# Patient Record
Sex: Female | Born: 1950 | Race: White | Hispanic: No | Marital: Married | Smoking: Never smoker
Health system: Southern US, Community
[De-identification: ages and names within clinical notes are randomized; demographics above are authoritative.]

## PROBLEM LIST (undated history)

## (undated) DIAGNOSIS — G8929 Other chronic pain: Secondary | ICD-10-CM

## (undated) DIAGNOSIS — F419 Anxiety disorder, unspecified: Secondary | ICD-10-CM

## (undated) DIAGNOSIS — E785 Hyperlipidemia, unspecified: Secondary | ICD-10-CM

## (undated) DIAGNOSIS — J189 Pneumonia, unspecified organism: Secondary | ICD-10-CM

## (undated) DIAGNOSIS — M25461 Effusion, right knee: Secondary | ICD-10-CM

## (undated) DIAGNOSIS — R519 Headache, unspecified: Secondary | ICD-10-CM

## (undated) DIAGNOSIS — I499 Cardiac arrhythmia, unspecified: Secondary | ICD-10-CM

## (undated) DIAGNOSIS — I10 Essential (primary) hypertension: Secondary | ICD-10-CM

## (undated) DIAGNOSIS — M1711 Unilateral primary osteoarthritis, right knee: Secondary | ICD-10-CM

## (undated) DIAGNOSIS — E559 Vitamin D deficiency, unspecified: Secondary | ICD-10-CM

## (undated) DIAGNOSIS — N950 Postmenopausal bleeding: Secondary | ICD-10-CM

## (undated) DIAGNOSIS — M1990 Unspecified osteoarthritis, unspecified site: Secondary | ICD-10-CM

## (undated) DIAGNOSIS — C801 Malignant (primary) neoplasm, unspecified: Secondary | ICD-10-CM

## (undated) HISTORY — PX: DILATION AND CURETTAGE OF UTERUS: SHX78

## (undated) HISTORY — PX: TONSILLECTOMY: SUR1361

## (undated) HISTORY — PX: WISDOM TOOTH EXTRACTION: SHX21

---

## 2005-08-19 ENCOUNTER — Ambulatory Visit: Payer: Self-pay | Admitting: Internal Medicine

## 2006-05-10 HISTORY — PX: COLONOSCOPY W/ POLYPECTOMY: SHX1380

## 2006-06-14 ENCOUNTER — Ambulatory Visit: Payer: Self-pay | Admitting: Unknown Physician Specialty

## 2006-06-14 ENCOUNTER — Other Ambulatory Visit: Payer: Self-pay

## 2009-04-30 ENCOUNTER — Ambulatory Visit: Payer: Self-pay | Admitting: Internal Medicine

## 2013-12-20 ENCOUNTER — Ambulatory Visit: Payer: Self-pay | Admitting: Internal Medicine

## 2015-11-25 ENCOUNTER — Other Ambulatory Visit: Payer: Self-pay | Admitting: Internal Medicine

## 2015-11-25 DIAGNOSIS — Z1231 Encounter for screening mammogram for malignant neoplasm of breast: Secondary | ICD-10-CM

## 2015-12-11 ENCOUNTER — Ambulatory Visit: Payer: Self-pay | Attending: Internal Medicine

## 2016-05-10 HISTORY — PX: COLONOSCOPY: SHX174

## 2017-05-17 ENCOUNTER — Other Ambulatory Visit: Payer: Self-pay | Admitting: Internal Medicine

## 2017-05-17 DIAGNOSIS — Z1231 Encounter for screening mammogram for malignant neoplasm of breast: Secondary | ICD-10-CM

## 2017-10-13 ENCOUNTER — Encounter (HOSPITAL_COMMUNITY): Payer: Self-pay

## 2017-10-13 ENCOUNTER — Ambulatory Visit
Admission: RE | Admit: 2017-10-13 | Discharge: 2017-10-13 | Disposition: A | Discharge status: Home / Self Care | Payer: Medicare Other | Source: Ambulatory Visit | Attending: Internal Medicine | Admitting: Internal Medicine

## 2017-10-13 DIAGNOSIS — Z1231 Encounter for screening mammogram for malignant neoplasm of breast: Secondary | ICD-10-CM | POA: Diagnosis not present

## 2017-10-13 HISTORY — DX: Malignant (primary) neoplasm, unspecified: C80.1

## 2019-04-13 ENCOUNTER — Other Ambulatory Visit: Payer: Self-pay

## 2019-04-13 DIAGNOSIS — Z20822 Contact with and (suspected) exposure to covid-19: Secondary | ICD-10-CM

## 2019-04-15 LAB — NOVEL CORONAVIRUS, NAA: SARS-CoV-2, NAA: NOT DETECTED

## 2019-06-20 IMAGING — MG MM DIGITAL SCREENING BILAT W/ TOMO W/ CAD
8 series · 8 of 24 positions shown · non-contrast
Comparison: Previous exam(s).

CLINICAL DATA: Screening.

EXAM:
DIGITAL SCREENING BILATERAL MAMMOGRAM WITH TOMO AND CAD

[R MLO synth-2D]
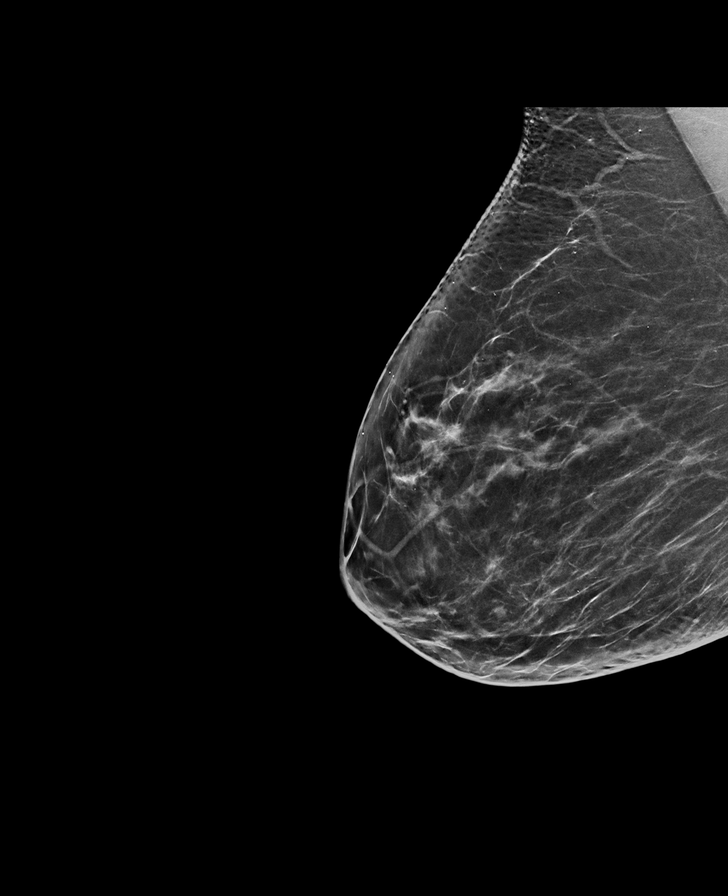

[R CC synth-2D]
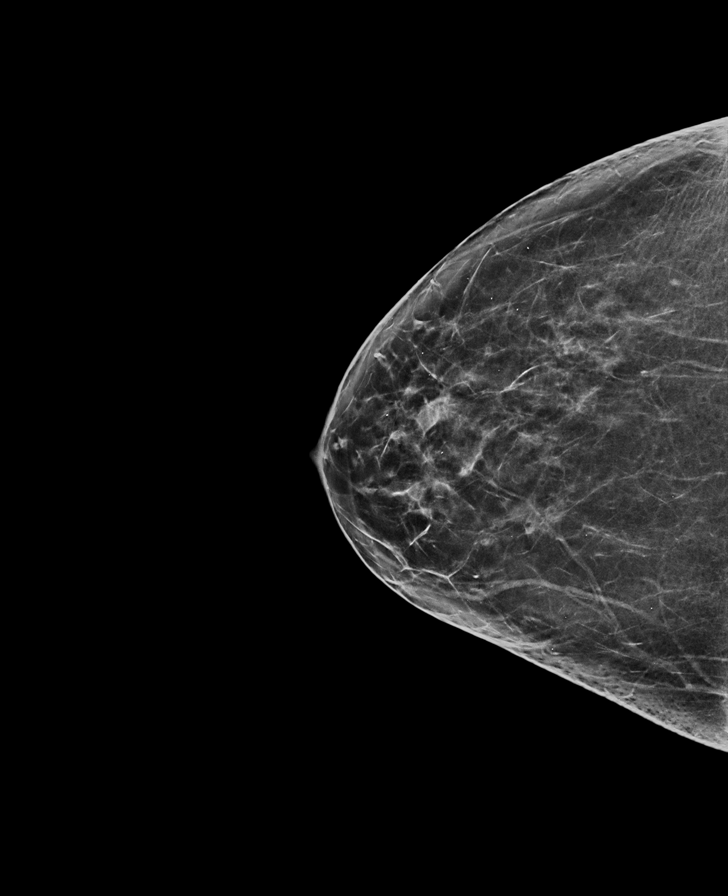

[L CC synth-2D]
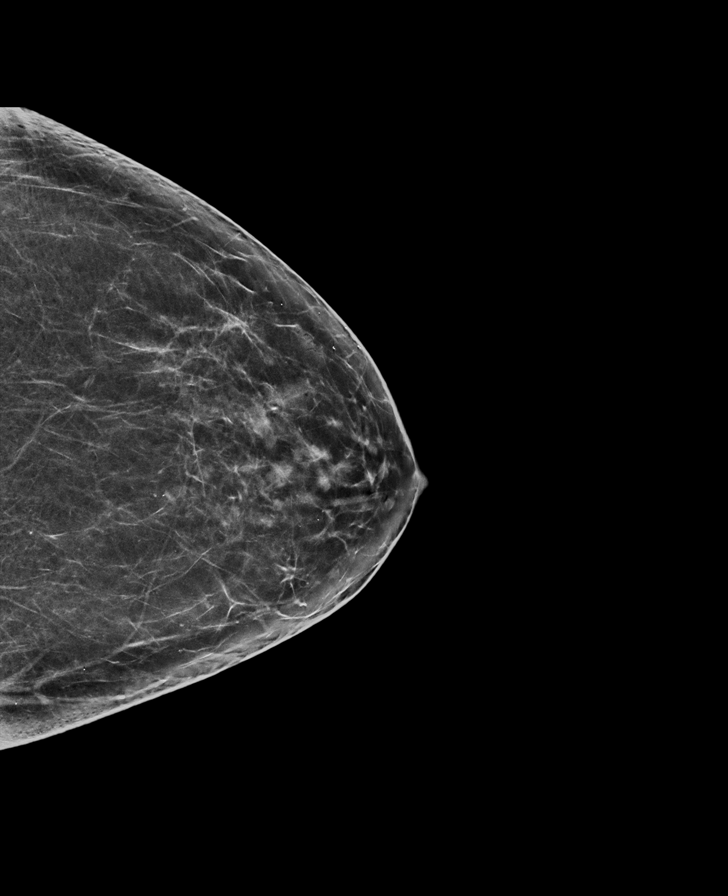

[L MLO synth-2D]
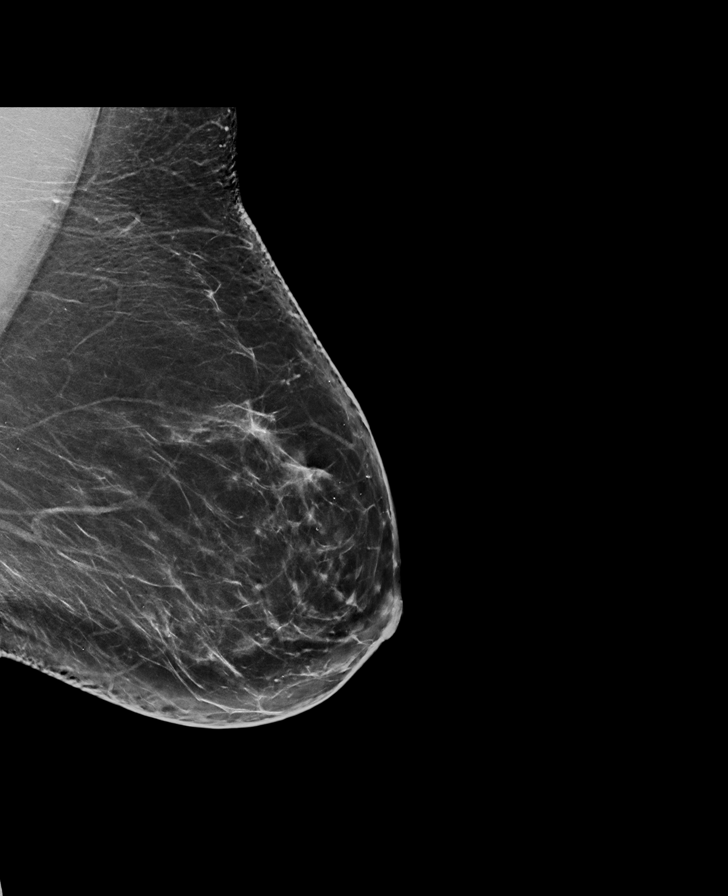

[R MLO tomo · tomo slice 37/72.0]
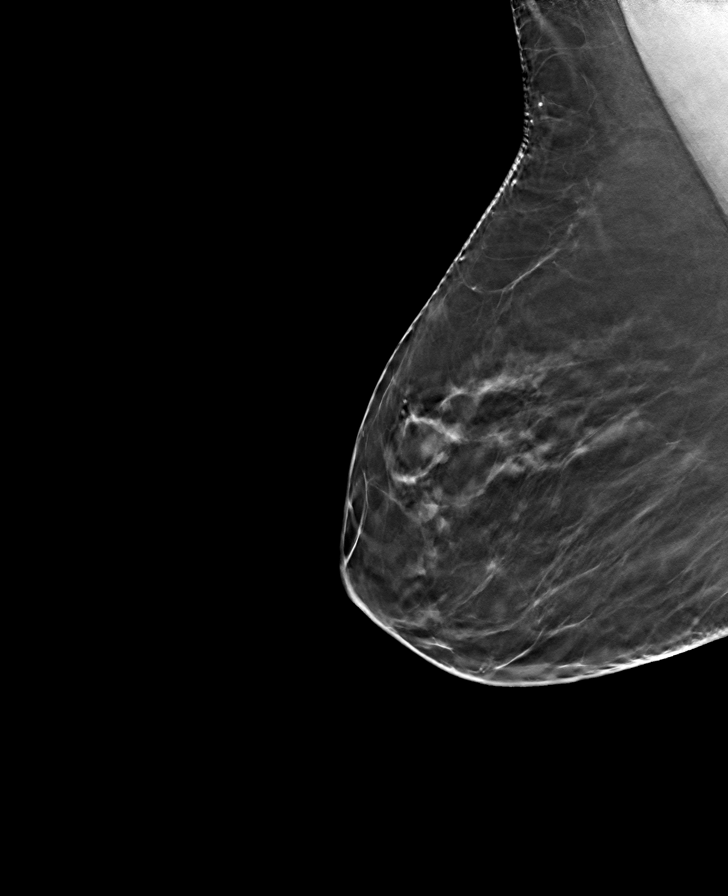

[L CC tomo · tomo slice 35/69.0]
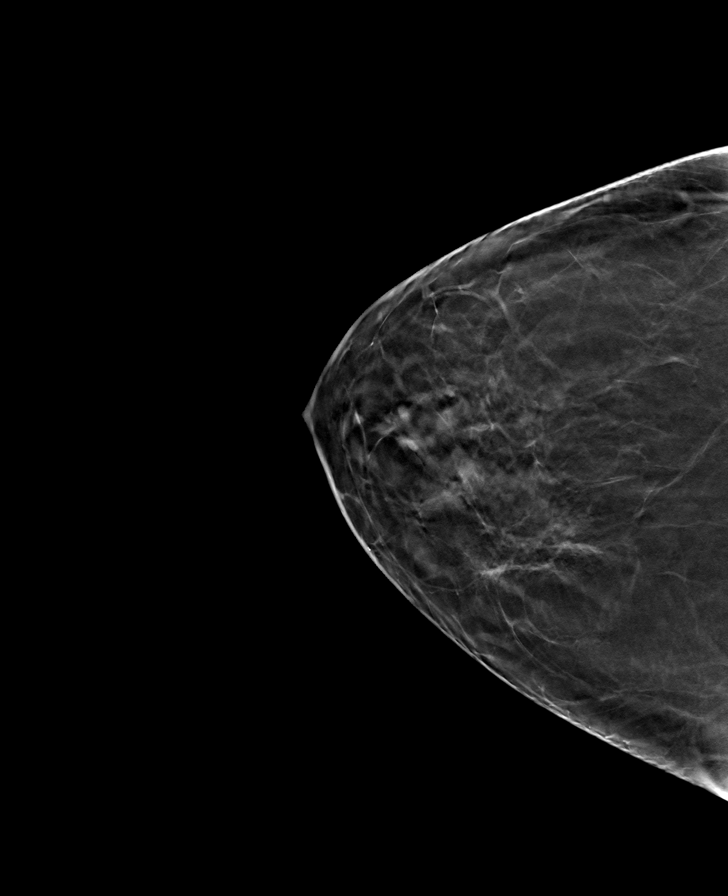

[R CC tomo · tomo slice 35/69.0]
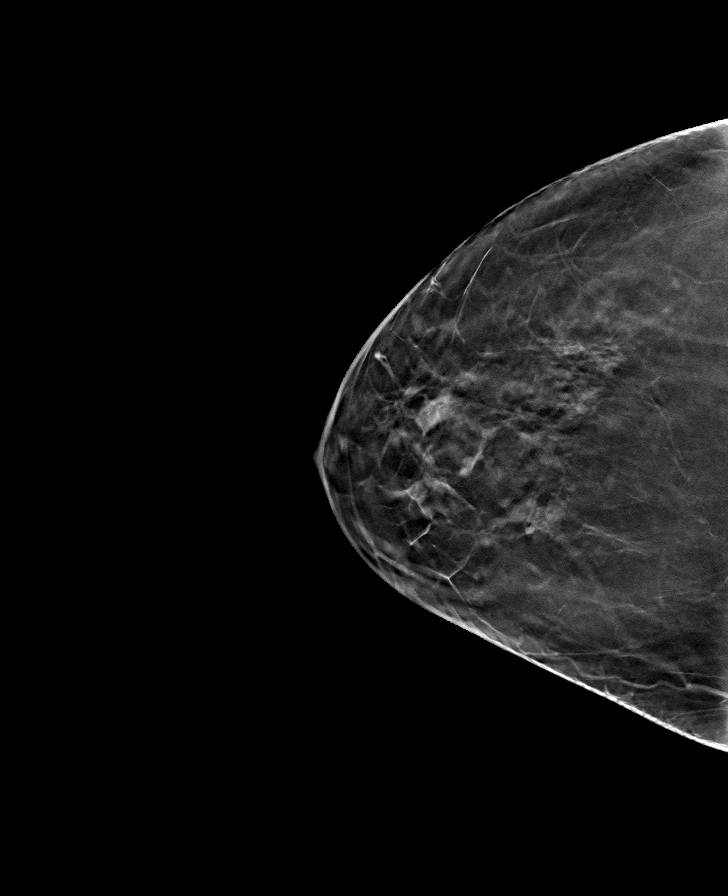

[L MLO tomo · tomo slice 43/85.0]
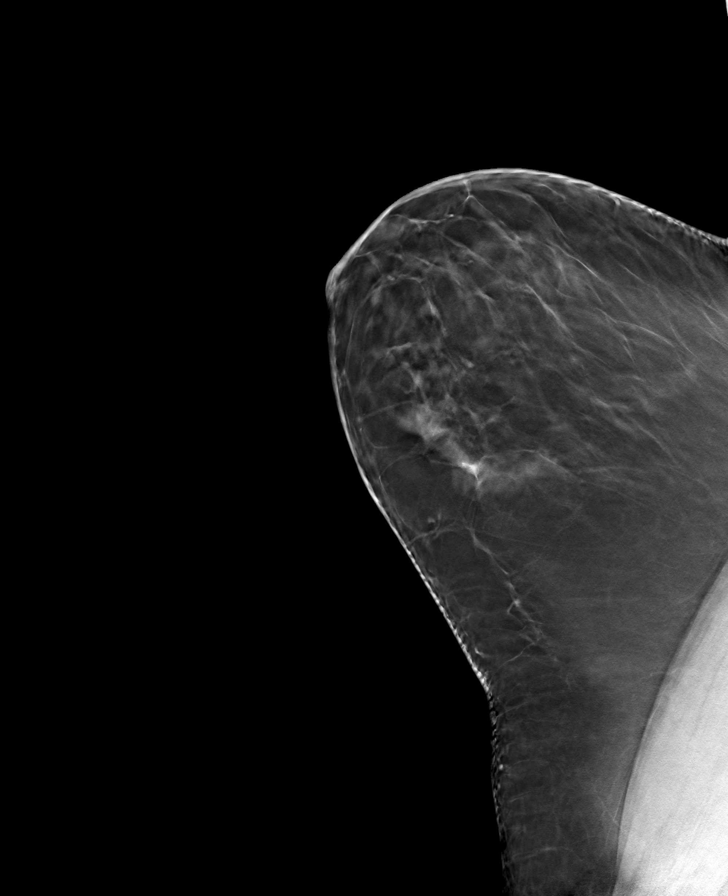

[8 of 24 positions shown; findings below may reference images not displayed]

ACR Breast Density Category b: There are scattered areas of
fibroglandular density.
FINDINGS: There are no findings suspicious for malignancy. Images were
processed with CAD.
IMPRESSION: No mammographic evidence of malignancy. A result letter of this
screening mammogram will be mailed directly to the patient.

RECOMMENDATION:
Screening mammogram in one year. (Code:[id number])

BI-RADS CATEGORY  1: Negative.

## 2019-09-13 ENCOUNTER — Other Ambulatory Visit: Payer: Self-pay | Admitting: Internal Medicine

## 2020-03-06 ENCOUNTER — Other Ambulatory Visit: Payer: Self-pay | Admitting: Internal Medicine

## 2020-03-06 DIAGNOSIS — Z1231 Encounter for screening mammogram for malignant neoplasm of breast: Secondary | ICD-10-CM

## 2020-04-14 ENCOUNTER — Other Ambulatory Visit: Payer: Self-pay

## 2020-04-14 ENCOUNTER — Ambulatory Visit
Admission: RE | Admit: 2020-04-14 | Discharge: 2020-04-14 | Disposition: A | Discharge status: Home / Self Care | Payer: Medicare Other | Source: Ambulatory Visit | Attending: Internal Medicine | Admitting: Internal Medicine

## 2020-04-14 DIAGNOSIS — Z1231 Encounter for screening mammogram for malignant neoplasm of breast: Secondary | ICD-10-CM | POA: Insufficient documentation

## 2020-05-10 HISTORY — PX: APPENDECTOMY: SHX54

## 2021-03-16 ENCOUNTER — Other Ambulatory Visit: Payer: Self-pay | Admitting: Internal Medicine

## 2021-05-20 ENCOUNTER — Other Ambulatory Visit: Payer: Self-pay | Admitting: Internal Medicine

## 2021-05-20 DIAGNOSIS — Z1231 Encounter for screening mammogram for malignant neoplasm of breast: Secondary | ICD-10-CM

## 2021-09-24 ENCOUNTER — Ambulatory Visit
Admission: RE | Admit: 2021-09-24 | Discharge: 2021-09-24 | Disposition: A | Discharge status: Home / Self Care | Payer: Medicare Other | Source: Ambulatory Visit | Attending: Internal Medicine | Admitting: Internal Medicine

## 2021-09-24 DIAGNOSIS — Z1231 Encounter for screening mammogram for malignant neoplasm of breast: Secondary | ICD-10-CM | POA: Diagnosis present

## 2022-04-21 ENCOUNTER — Other Ambulatory Visit: Payer: Self-pay | Admitting: Obstetrics and Gynecology

## 2022-04-21 DIAGNOSIS — N950 Postmenopausal bleeding: Secondary | ICD-10-CM

## 2022-04-21 DIAGNOSIS — N889 Noninflammatory disorder of cervix uteri, unspecified: Secondary | ICD-10-CM

## 2022-04-29 ENCOUNTER — Ambulatory Visit: Payer: Medicare Other

## 2022-05-11 ENCOUNTER — Other Ambulatory Visit: Payer: Medicare Other

## 2022-05-13 ENCOUNTER — Ambulatory Visit
Admission: RE | Admit: 2022-05-13 | Discharge: 2022-05-13 | Disposition: A | Discharge status: Home / Self Care | Payer: Medicare Other | Source: Ambulatory Visit | Attending: Obstetrics and Gynecology | Admitting: Obstetrics and Gynecology

## 2022-05-13 DIAGNOSIS — N950 Postmenopausal bleeding: Secondary | ICD-10-CM | POA: Insufficient documentation

## 2022-05-13 DIAGNOSIS — N889 Noninflammatory disorder of cervix uteri, unspecified: Secondary | ICD-10-CM | POA: Insufficient documentation

## 2022-05-20 ENCOUNTER — Other Ambulatory Visit: Payer: Self-pay | Admitting: Obstetrics and Gynecology

## 2022-05-24 ENCOUNTER — Ambulatory Visit: Payer: Medicare Other | Admitting: Certified Registered Nurse Anesthetist

## 2022-05-24 ENCOUNTER — Encounter
Admission: RE | Admit: 2022-05-24 | Discharge: 2022-05-24 | Disposition: A | Discharge status: Home / Self Care | Payer: Medicare Other | Source: Ambulatory Visit | Attending: Obstetrics and Gynecology | Admitting: Obstetrics and Gynecology

## 2022-05-24 ENCOUNTER — Ambulatory Visit: Payer: Medicare Other | Admitting: Urgent Care

## 2022-05-24 DIAGNOSIS — Z01812 Encounter for preprocedural laboratory examination: Secondary | ICD-10-CM

## 2022-05-24 HISTORY — DX: Postmenopausal bleeding: N95.0

## 2022-05-24 HISTORY — DX: Essential (primary) hypertension: I10

## 2022-05-24 HISTORY — DX: Headache, unspecified: R51.9

## 2022-05-24 HISTORY — DX: Other chronic pain: G89.29

## 2022-05-24 HISTORY — DX: Vitamin D deficiency, unspecified: E55.9

## 2022-05-24 HISTORY — DX: Hyperlipidemia, unspecified: E78.5

## 2022-05-24 HISTORY — DX: Effusion, right knee: M25.461

## 2022-05-24 HISTORY — DX: Unspecified osteoarthritis, unspecified site: M19.90

## 2022-05-24 HISTORY — DX: Unilateral primary osteoarthritis, right knee: M17.11

## 2022-05-24 HISTORY — DX: Anxiety disorder, unspecified: F41.9

## 2022-05-24 HISTORY — DX: Pneumonia, unspecified organism: J18.9

## 2022-05-24 HISTORY — DX: Cardiac arrhythmia, unspecified: I49.9

## 2022-05-24 NOTE — Patient Instructions (Addendum)
Your procedure is scheduled on: 06/03/22 - Thursday Report to the Registration Desk on the 1st floor of the [facility]. To find out your arrival time, please call [phone] between 1PM - 3PM on: 06/02/22 - Wednesday If your arrival time is 6:00 am, do not arrive prior to that time as the [place] entrance doors do not open until 6:00 am.  REMEMBER: Instructions that are not followed completely may result in serious medical risk, up to and including death; or upon the discretion of your surgeon and anesthesiologist your surgery may need to be rescheduled.  Do not eat food after midnight the night before surgery.  No gum chewing, lozengers or hard candies.  You may however, drink CLEAR liquids up to 2 hours before you are scheduled to arrive for your surgery. Do not drink anything within 2 hours of your scheduled arrival time.  Clear liquids include: - water  - apple juice without pulp - gatorade (not RED colors) - black coffee or tea (Do NOT add milk or creamers to the coffee or tea) Do NOT drink anything that is not on this list.  In addition, your doctor has ordered for you to drink the provided  Ensure Pre-Surgery Clear Carbohydrate Drink  Drinking this carbohydrate drink up to two hours before surgery helps to reduce insulin resistance and improve patient outcomes. Please complete drinking 2 hours prior to scheduled arrival time.  TAKE THESE MEDICATIONS THE MORNING OF SURGERY WITH A SIP OF WATER:  - amLODipine (NORVASC)  - metoprolol succinate (TOPROL-XL)   One week prior to surgery: Stop beginning 05/27/22: Stop Anti-inflammatories (NSAIDS) such as Advil, Aleve, Ibuprofen, Motrin, Naproxen, Naprosyn and Aspirin based products such as Excedrin, Goodys Powder, BC Powder.  Stop ANY OVER THE COUNTER supplements until after surgery.  You may however, continue to take Tylenol if needed for pain up until the day of surgery.  No Alcohol for 24 hours before or after  surgery.  No Smoking including e-cigarettes for 24 hours prior to surgery.  No chewable tobacco products for at least 6 hours prior to surgery.  No nicotine patches on the day of surgery.  Do not use any "recreational" drugs for at least a week prior to your surgery.  Please be advised that the combination of cocaine and anesthesia may have negative outcomes, up to and including death. If you test positive for cocaine, your surgery will be cancelled.  On the morning of surgery brush your teeth with toothpaste and water, you may rinse your mouth with mouthwash if you wish. Do not swallow any toothpaste or mouthwash.  Do not wear jewelry, make-up, hairpins, clips or nail polish.  Do not wear lotions, powders, or perfumes.   Do not shave body from the neck down 48 hours prior to surgery just in case you cut yourself which could leave a site for infection. Also, freshly shaved skin may become irritated if using the CHG soap.  Contact lenses, hearing aids and dentures may not be worn into surgery.  Do not bring valuables to the hospital. [facility] is not responsible for any missing/lost belongings or valuables.   Notify your doctor if there is any change in your medical condition (cold, fever, infection).  Wear comfortable clothing (specific to your surgery type) to the hospital.  After surgery, you can help prevent lung complications by doing breathing exercises.  Take deep breaths and cough every 1-2 hours. Your doctor may order a device called an Incentive Spirometer to help  you take deep breaths. When coughing or sneezing, hold a pillow firmly against your incision with both hands. This is called "splinting." Doing this helps protect your incision. It also decreases belly discomfort.  If you are being admitted to the hospital overnight, leave your suitcase in the car. After surgery it may be brought to your room.  If you are being discharged the day of surgery, you will not be  allowed to drive home. You will need a responsible adult (18 years or older) to drive you home and stay with you that night.   If you are taking public transportation, you will need to have a responsible adult (18 years or older) with you. Please confirm with your physician that it is acceptable to use public transportation.   Please call the [place] Dept. at [phone] if you have any questions about these instructions.  Surgery Visitation Policy:  Patients undergoing a surgery or procedure may have two family members or support persons with them as long as the person is not COVID-19 positive or experiencing its symptoms.   Inpatient Visitation:    Visiting hours are 7 a.m. to 8 p.m. Up to four visitors are allowed at one time in a patient room. The visitors may rotate out with other people during the day. One designated support person (adult) may remain overnight.  Due to an increase in RSV and influenza rates and associated hospitalizations, children ages 20 and under will not be able to visit patients in [facility]. Masks continue to be strongly recommended.

## 2022-05-28 ENCOUNTER — Encounter
Admission: RE | Admit: 2022-05-28 | Discharge: 2022-05-28 | Disposition: A | Discharge status: Home / Self Care | Payer: Medicare Other | Source: Ambulatory Visit | Attending: Obstetrics and Gynecology | Admitting: Obstetrics and Gynecology

## 2022-05-28 DIAGNOSIS — R9389 Abnormal findings on diagnostic imaging of other specified body structures: Secondary | ICD-10-CM | POA: Diagnosis not present

## 2022-05-28 DIAGNOSIS — N841 Polyp of cervix uteri: Secondary | ICD-10-CM | POA: Insufficient documentation

## 2022-05-28 DIAGNOSIS — Z01812 Encounter for preprocedural laboratory examination: Secondary | ICD-10-CM

## 2022-05-28 DIAGNOSIS — N950 Postmenopausal bleeding: Secondary | ICD-10-CM | POA: Diagnosis not present

## 2022-05-28 LAB — BASIC METABOLIC PANEL
Anion gap: 10 (ref 5–15)
BUN: 12 mg/dL (ref 8–23)
CO2: 25 mmol/L (ref 22–32)
Calcium: 9.4 mg/dL (ref 8.9–10.3)
Chloride: 101 mmol/L (ref 98–111)
Creatinine, Ser: 0.62 mg/dL (ref 0.44–1.00)
GFR, Estimated: >60 mL/min (ref >60)
Glucose, Bld: 85 mg/dL (ref 70–99)
Potassium: 3.9 mmol/L (ref 3.5–5.1)
Sodium: 136 mmol/L (ref 135–145)

## 2022-05-28 LAB — CBC
HCT: 45.5 % (ref 36.0–46.0)
Hemoglobin: 14.4 g/dL (ref 12.0–15.0)
MCH: 29.6 pg (ref 26.0–34.0)
MCHC: 31.6 g/dL (ref 30.0–36.0)
MCV: 93.4 fL (ref 80.0–100.0)
Platelets: 274 10*3/uL (ref 150–400)
RBC: 4.87 MIL/uL (ref 3.87–5.11)
RDW: 13.3 % (ref 11.5–15.5)
WBC: 7.7 10*3/uL (ref 4.0–10.5)
nRBC: 0 % (ref 0.0–0.2)

## 2022-05-28 LAB — TYPE AND SCREEN
ABO/RH(D): A POS
Antibody Screen: NEGATIVE

## 2022-05-29 LAB — SYPHILIS: RPR W/REFLEX TO RPR TITER AND TREPONEMAL ANTIBODIES, TRADITIONAL SCREENING AND DIAGNOSIS ALGORITHM: RPR Ser Ql: NONREACTIVE

## 2022-06-03 ENCOUNTER — Encounter: Payer: Self-pay | Admitting: Obstetrics and Gynecology

## 2022-06-03 ENCOUNTER — Ambulatory Visit
Admission: RE | Admit: 2022-06-03 | Discharge: 2022-06-03 | Disposition: A | Discharge status: Home / Self Care | Payer: Medicare Other | Source: Ambulatory Visit | Attending: Obstetrics and Gynecology | Admitting: Obstetrics and Gynecology

## 2022-06-03 ENCOUNTER — Encounter
Admission: RE | Disposition: A | Discharge status: Home / Self Care | Payer: Self-pay | Source: Ambulatory Visit | Attending: Obstetrics and Gynecology

## 2022-06-03 ENCOUNTER — Other Ambulatory Visit: Payer: Self-pay

## 2022-06-03 DIAGNOSIS — N950 Postmenopausal bleeding: Secondary | ICD-10-CM | POA: Diagnosis present

## 2022-06-03 DIAGNOSIS — Z538 Procedure and treatment not carried out for other reasons: Secondary | ICD-10-CM | POA: Diagnosis not present

## 2022-06-03 DIAGNOSIS — N841 Polyp of cervix uteri: Secondary | ICD-10-CM | POA: Diagnosis not present

## 2022-06-03 DIAGNOSIS — R9389 Abnormal findings on diagnostic imaging of other specified body structures: Secondary | ICD-10-CM | POA: Diagnosis not present

## 2022-06-03 LAB — ABO/RH: ABO/RH(D): A POS

## 2022-06-03 SURGERY — DILATATION AND CURETTAGE /HYSTEROSCOPY
Anesthesia: General

## 2022-06-03 MED ORDER — ORAL CARE MOUTH RINSE: 15.0000 mL | Freq: Once | OROMUCOSAL | Status: AC

## 2022-06-03 MED ORDER — LACTATED RINGERS IV SOLN: INTRAVENOUS | Status: DC

## 2022-06-03 MED ORDER — FAMOTIDINE 20 MG PO TABS: 20.0000 mg | ORAL_TABLET | Freq: Once | ORAL | Status: AC

## 2022-06-03 MED ORDER — CHLORHEXIDINE GLUCONATE 0.12 % MT SOLN: 15.0000 mL | Freq: Once | OROMUCOSAL | Status: AC

## 2022-06-03 MED ORDER — FENTANYL CITRATE (PF) 100 MCG/2ML IJ SOLN
INTRAMUSCULAR | Status: AC
  Filled 2022-06-03: qty 2

## 2022-06-03 MED ORDER — CHLORHEXIDINE GLUCONATE 0.12 % MT SOLN
OROMUCOSAL | Status: AC
  Administered 2022-06-03: 15 mL via OROMUCOSAL
  Filled 2022-06-03: qty 15

## 2022-06-03 MED ORDER — LIDOCAINE HCL (PF) 2 % IJ SOLN
INTRAMUSCULAR | Status: AC
  Filled 2022-06-03: qty 5

## 2022-06-03 MED ORDER — PROPOFOL 10 MG/ML IV BOLUS
INTRAVENOUS | Status: AC
  Filled 2022-06-03: qty 20

## 2022-06-03 MED ORDER — FAMOTIDINE 20 MG PO TABS
ORAL_TABLET | ORAL | Status: AC
  Administered 2022-06-03: 20 mg via ORAL
  Filled 2022-06-03: qty 1

## 2022-06-03 MED ORDER — MIDAZOLAM HCL 2 MG/2ML IJ SOLN
INTRAMUSCULAR | Status: AC
  Filled 2022-06-03: qty 2

## 2022-06-03 SURGICAL SUPPLY — 26 items
BAG URINE DRAIN 2000ML AR STRL (UROLOGICAL SUPPLIES) IMPLANT
CATH FOLEY 2WAY  5CC 16FR (CATHETERS)
CATH URTH 16FR FL 2W BLN LF (CATHETERS) IMPLANT
DEVICE MYOSURE LITE (MISCELLANEOUS) IMPLANT
DEVICE MYOSURE REACH (MISCELLANEOUS) IMPLANT
DRSG TELFA 3X8 NADH STRL (GAUZE/BANDAGES/DRESSINGS) IMPLANT
ELECT REM PT RETURN 9FT ADLT (ELECTROSURGICAL) ×1
ELECTRODE REM PT RTRN 9FT ADLT (ELECTROSURGICAL) ×1 IMPLANT
GLOVE BIO SURGEON STRL SZ7 (GLOVE) ×1 IMPLANT
GLOVE BIOGEL PI IND STRL 7.5 (GLOVE) ×1 IMPLANT
GOWN STRL REUS W/ TWL LRG LVL3 (GOWN DISPOSABLE) ×2 IMPLANT
GOWN STRL REUS W/TWL LRG LVL3 (GOWN DISPOSABLE) ×2
IV NS IRRIG 3000ML ARTHROMATIC (IV SOLUTION) ×1 IMPLANT
KIT PROCEDURE FLUENT (KITS) ×1 IMPLANT
KIT TURNOVER CYSTO (KITS) ×1 IMPLANT
MANIFOLD NEPTUNE II (INSTRUMENTS) ×1 IMPLANT
PACK DNC HYST (MISCELLANEOUS) ×1 IMPLANT
PAD OB MATERNITY 4.3X12.25 (PERSONAL CARE ITEMS) ×1 IMPLANT
PAD PREP 24X41 OB/GYN DISP (PERSONAL CARE ITEMS) ×1 IMPLANT
SCRUB CHG 4% DYNA-HEX 4OZ (MISCELLANEOUS) ×1 IMPLANT
SEAL ROD LENS SCOPE MYOSURE (ABLATOR) ×1 IMPLANT
SET CYSTO W/LG BORE CLAMP LF (SET/KITS/TRAYS/PACK) IMPLANT
SURGILUBE 2OZ TUBE FLIPTOP (MISCELLANEOUS) ×1 IMPLANT
TRAP FLUID SMOKE EVACUATOR (MISCELLANEOUS) ×1 IMPLANT
TUBING CONNECTING 10 (TUBING) ×1 IMPLANT
WATER STERILE IRR 500ML POUR (IV SOLUTION) ×1 IMPLANT

## 2022-06-03 NOTE — Discharge Instructions (Signed)

## 2022-06-03 NOTE — Anesthesia Preprocedure Evaluation (Deleted)
Anesthesia Evaluation  Patient identified by MRN, date of birth, ID band Patient awake    Reviewed: Allergy & Precautions, NPO status , Patient's Chart, lab work & pertinent test results  History of Anesthesia Complications Negative for: history of anesthetic complications  Airway Mallampati: III  TM Distance: <3 FB Neck ROM: full    Dental  (+) Chipped, Poor Dentition   Pulmonary neg pulmonary ROS, neg shortness of breath   Pulmonary exam normal        Cardiovascular Exercise Tolerance: Good hypertension, (-) Past MI + dysrhythmias Supra Ventricular Tachycardia      Neuro/Psych  Headaches  Anxiety        GI/Hepatic negative GI ROS, Neg liver ROS,neg GERD  ,,  Endo/Other  negative endocrine ROS    Renal/GU      Musculoskeletal   Abdominal   Peds  Hematology negative hematology ROS (+)   Anesthesia Other Findings Past Medical History: No date: Anxiety No date: Arthritis No date: Cancer ([place])     Comment:  skin No date: Chronic pain of right knee No date: Dysrhythmia     Comment:  racing heartbeat No date: Effusion of right knee No date: Headache No date: HLD (hyperlipidemia) No date: Hypertension No date: Osteoarthritis of right knee No date: PMB (postmenopausal bleeding) No date: Pneumonia     Comment:  1983 No date: Vitamin D deficiency  Past Surgical History: 2022: APPENDECTOMY 2018: COLONOSCOPY 2008: COLONOSCOPY W/ POLYPECTOMY No date: DILATION AND CURETTAGE OF UTERUS     Comment:  1981 No date: TONSILLECTOMY No date: WISDOM TOOTH EXTRACTION  BMI    Body Mass Index: 32.44 kg/m      Reproductive/Obstetrics negative OB ROS                             Anesthesia Physical Anesthesia Plan  ASA: 2  Anesthesia Plan: General LMA   Post-op Pain Management:    Induction: Intravenous  PONV Risk Score and Plan: Dexamethasone, Ondansetron, Midazolam and Treatment may  vary due to age or medical condition  Airway Management Planned: LMA  Additional Equipment:   Intra-op Plan:   Post-operative Plan: Extubation in OR  Informed Consent: I have reviewed the patients History and Physical, chart, labs and discussed the procedure including the risks, benefits and alternatives for the proposed anesthesia with the patient or authorized representative who has indicated his/her understanding and acceptance.     Dental Advisory Given  Plan Discussed with: Anesthesiologist, CRNA and Surgeon  Anesthesia Plan Comments: (Patient consented for risks of anesthesia including but not limited to:  - adverse reactions to medications - damage to eyes, teeth, lips or other oral mucosa - nerve damage due to positioning  - sore throat or hoarseness - Damage to heart, brain, nerves, lungs, other parts of body or loss of life  Patient voiced understanding.)       Anesthesia Quick Evaluation

## 2022-06-03 NOTE — Progress Notes (Signed)
Patient and Dr. Glennon Mac agree to cancel surgery today due to late hour, pt will reschedule.

## 2022-06-09 NOTE — H&P (Signed)
Preoperative History and Physical   Chief complaint: Gabriella Paul is a 72 y.o. here for surgical management of postmenopausal bleeding, thickened endometrium, cervical polyp.   No significant preoperative concerns.   History of Present Illness: 72 y.o. menopausal female who is seen in referral from Cheryll Cockayne, MD from [facility] for postmenopausal bleeding.     Bleeding with lower back pain/pressure since beginning of November. Started 11/4, ended around 04/02/22.  The bleeding has been off and on, alternating between heavy and spotting.  She believes she went through menopause at around age 77 years.  She believes it was at least 10 years ago.  Since that time she has had no issues. She is sure this is vaginal bleeding. She doesn't recall having bad periods when she was younger.  She had discomfort that was similar to a menstrual cycle.  She notes no unintended weight loss (she has actually has gained weight).  She denies new constipation, new bloating, new early satiety.    She was scheduled for a total knee replacement of her right knee on 03/22/22.  The week before this she started bleeding.  Her surgeon in [place] (Dr. Silvio Clayman).    Health Maintenance: Colon cancer screening: up to date. Due again in 4 years.  Pap smear: 2019: NILM (no HPV) Mammography: 09/2021 - [facility]   Pelvic ultrasound on 05/13/2022 (report): FINDINGS:  Uterus:  Measurements: 7.5 x 3.0 x 4.1 cm = volume: 49 mL. Anteverted.  Calcified subserosal leiomyoma posterior upper uterus 19 mm  diameter. No additional masses. Nabothian cyst at cervix.   Endometrium: Thickness: 9 mm. Thickened, heterogeneous. No definite endometrial  fluid or discrete mass.   Right ovary: Not visualized, likely obscured by bowel   Left ovary: Not visualized, likely obscured by bowel   Other findings: No free pelvic fluid or adnexal masses.    Proposed surgery: Hysteroscopy, dilation and curettage, cervical polypectomy (on  06/03/2022)       Past Medical History:  Diagnosis Date   Allergic state     Anxiety 1973    Have under control now   Arthritis 2000    In finger joints and knee   Hyperlipidemia     Hypertension 2019    Metoprolol for racing heartbeat   Vitamin D deficiency           Past Surgical History:  Procedure Laterality Date   TONSILLECTOMY   1973   COLONOSCOPY   06/14/2006    Hyperplastic Polyp: CBF 06/2016; Recall Ltr mailed 04/27/2016 (dw)   COLONOSCOPY   03/24/2017    Int Hemorrhoids: CBF 03/2027   APPENDECTOMY        2/22 in [place]; appendix ruptured at time of presentation    OB History  No obstetric history on file.  Patient denies any other pertinent gynecologic issues.          Current Outpatient Medications on File Prior to Visit  Medication Sig Dispense Refill   amLODIPine (NORVASC) 5 MG tablet TAKE 1 TABLET BY MOUTH ONCE DAILY. 90 tablet 1   calcium carbonate (CALCIUM 500 ORAL) Take by mouth       cranberry fruit extract (CRANBERRY EXTRACT ORAL) Take by mouth One daily       ELDERBERRY FRUIT ORAL Take by mouth       ergocalciferol, vitamin D2, (VITAMIN D3 ORAL) Take by mouth once daily          metoprolol succinate (TOPROL-XL) 25 MG XL tablet TAKE 1  TABLET BY MOUTH ONCE DAILY. 90 tablet 3   ZINC ORAL Take 50 mg by mouth once daily       aspirin 81 MG EC tablet Take 81 mg by mouth once daily. (Patient not taking: Reported on 04/19/2022)        No current facility-administered medications on file prior to visit.        Allergies  Allergen Reactions   Pitocin [Oxytocin] Rash   Flagyl [Metronidazole] Nausea      Social History:   reports that she quit smoking about 42 years ago. Her smoking use included cigarettes. She has never used smokeless tobacco. She reports current alcohol use. She reports that she does not use drugs.        Family History  Problem Relation Age of Onset   High blood pressure (Hypertension) Father     Alzheimer's disease Father         Review of Systems: Noncontributory   PHYSICAL EXAM: Blood pressure (!) 187/98, pulse (!) 111, weight 86.3 kg (190 lb 3.2 oz). CONSTITUTIONAL: Well-developed, well-nourished female in no acute distress.  HENT:  Normocephalic, atraumatic, External right and left ear normal. Oropharynx is clear and moist EYES: Conjunctivae and EOM are normal. Pupils are equal, round, and reactive to light. No scleral icterus.  NECK: Normal range of motion, supple, no masses SKIN: Skin is warm and dry. No rash noted. Not diaphoretic. No erythema. No pallor. [place]: Alert and oriented to person, place, and time. Normal reflexes, muscle tone coordination. No cranial nerve deficit noted. PSYCHIATRIC: Normal mood and affect. Normal behavior. Normal judgment and thought content. CARDIOVASCULAR: Normal heart rate noted, regular rhythm RESPIRATORY: Effort and breath sounds normal, no problems with respiration noted ABDOMEN: Soft, nontender, nondistended. PELVIC: Deferred MUSCULOSKELETAL: Normal range of motion. No edema and no tenderness. 2+ distal pulses.   Labs: Recent Results  No results found for this or any previous visit (from the past 336 hour(s)).     Imaging Studies: No results found.   Assessment: 1. PMB (postmenopausal bleeding)   2. Thickened endometrium   3. Cervical lesion       Plan: Patient will undergo surgical management with the above.   The risks of surgery were discussed in detail with the patient including but not limited to: bleeding which may require transfusion or reoperation; infection which may require antibiotics; injury to surrounding organs which may involve bowel, bladder, ureters ; need for additional procedures including laparoscopy or laparotomy; thromboembolic phenomenon, surgical site problems and other postoperative/anesthesia complications. Likelihood of success in alleviating the patient's condition was discussed. Routine postoperative instructions will be reviewed  with the patient and her family in detail after surgery.  The patient concurred with the proposed plan, giving informed written consent for the surgery.   Preoperative prophylactic antibiotics, as indicated, and SCDs ordered on call to the OR.     Return in 4 weeks (on 06/21/2022) for Post-op check.    Attestation Statement:    I personally performed the service. (TP)   [place], MD  [place] 05/24/2022 9:18 AM

## 2022-06-10 ENCOUNTER — Encounter
Admission: RE | Admit: 2022-06-10 | Discharge: 2022-06-10 | Disposition: A | Discharge status: Home / Self Care | Payer: Medicare Other | Source: Ambulatory Visit | Attending: Surgery | Admitting: Surgery

## 2022-06-10 NOTE — Patient Instructions (Signed)
Your procedure is scheduled on:06-14-22 Monday Report to the Registration Desk on the 1st floor of the [facility].Then proceed to the 2nd floor Surgery Desk To find out your arrival time, please call [phone] between 1PM - 3PM on:06-11-22 Friday If your arrival time is 6:00 am, do not arrive prior to that time as the [facility] entrance doors do not open until 6:00 am.  REMEMBER: Instructions that are not followed completely may result in serious medical risk, up to and including death; or upon the discretion of your surgeon and anesthesiologist your surgery may need to be rescheduled.  Do not eat food after midnight the night before surgery.  No gum chewing, lozengers or hard candies.  You may however, drink CLEAR liquids up to 2 hours before you are scheduled to arrive for your surgery. Do not drink anything within 2 hours of your scheduled arrival time.  Clear liquids include: - water  - apple juice without pulp - gatorade (not RED colors) - black coffee or tea (Do NOT add milk or creamers to the coffee or tea) Do NOT drink anything that is not on this list.  In addition, your doctor has ordered for you to drink the provided  Ensure Pre-Surgery Clear Carbohydrate Drink  Drinking this carbohydrate drink up to two hours before surgery helps to reduce insulin resistance and improve patient outcomes. Please complete drinking 2 hours prior to scheduled arrival time.  TAKE THESE MEDICATIONS THE MORNING OF SURGERY WITH A SIP OF WATER: -amLODipine (NORVASC)  -metoprolol succinate (TOPROL-XL)   One week prior to surgery: Stop Anti-inflammatories (NSAIDS) such as Advil, Aleve, Ibuprofen, Motrin, Naproxen, Naprosyn and Aspirin based products such as Excedrin, Goodys Powder, BC Powder.You may however, continue to take Tylenol if needed for pain up until the day of surgery.  Stop ANY OVER THE COUNTER supplements/vitamins NOW (06-10-22) until after surgery.  No Alcohol for 24 hours before  or after surgery.  No Smoking including e-cigarettes for 24 hours prior to surgery.  No chewable tobacco products for at least 6 hours prior to surgery.  No nicotine patches on the day of surgery.  Do not use any "recreational" drugs for at least a week prior to your surgery.  Please be advised that the combination of cocaine and anesthesia may have negative outcomes, up to and including death. If you test positive for cocaine, your surgery will be cancelled.  On the morning of surgery brush your teeth with toothpaste and water, you may rinse your mouth with mouthwash if you wish. Do not swallow any toothpaste or mouthwash.  Use CHG Soap as directed on instruction sheet.  Do not wear jewelry, make-up, hairpins, clips or nail polish.  Do not wear lotions, powders, or perfumes.   Do not shave body from the neck down 48 hours prior to surgery just in case you cut yourself which could leave a site for infection.  Also, freshly shaved skin may become irritated if using the CHG soap.  Contact lenses, hearing aids and dentures may not be worn into surgery.  Do not bring valuables to the hospital. [facility] is not responsible for any missing/lost belongings or valuables.   Notify your doctor if there is any change in your medical condition (cold, fever, infection).  Wear comfortable clothing (specific to your surgery type) to the hospital.  After surgery, you can help prevent lung complications by doing breathing exercises.  Take deep breaths and cough every 1-2 hours. Your doctor may order a  device called an Incentive Spirometer to help you take deep breaths. When coughing or sneezing, hold a pillow firmly against your incision with both hands. This is called "splinting." Doing this helps protect your incision. It also decreases belly discomfort.  If you are being admitted to the hospital overnight, leave your suitcase in the car. After surgery it may be brought to your room.  In case  of increased patient census, it may be necessary for you, the patient, to continue your postoperative care within the Same Day Surgery department.  If you are being discharged the day of surgery, you will not be allowed to drive home. You will need a responsible individual to drive you home and stay with you for 24 hours after surgery.   If you are taking public transportation, you will need to have a responsible adult (18 years or older) with you. Please confirm with your physician that it is acceptable to use public transportation.   Please call the [place] Dept. at [phone] if you have any questions about these instructions.  Surgery Visitation Policy:  Patients undergoing a surgery or procedure may have two family members or support persons with them as long as the person is not COVID-19 positive or experiencing its symptoms.   Due to an increase in RSV and influenza rates and associated hospitalizations, children ages 68 and under will not be able to visit patients in [facility]. Masks continue to be strongly recommended.

## 2022-06-14 ENCOUNTER — Ambulatory Visit
Admission: RE | Admit: 2022-06-14 | Discharge: 2022-06-14 | Disposition: A | Discharge status: Home / Self Care | Payer: Medicare Other | Source: Ambulatory Visit | Attending: Obstetrics and Gynecology | Admitting: Obstetrics and Gynecology

## 2022-06-14 ENCOUNTER — Other Ambulatory Visit: Payer: Self-pay

## 2022-06-14 ENCOUNTER — Encounter: Payer: Self-pay | Admitting: Obstetrics and Gynecology

## 2022-06-14 ENCOUNTER — Encounter
Admission: RE | Disposition: A | Discharge status: Home / Self Care | Payer: Self-pay | Source: Ambulatory Visit | Attending: Obstetrics and Gynecology

## 2022-06-14 ENCOUNTER — Ambulatory Visit: Payer: Medicare Other | Admitting: Anesthesiology

## 2022-06-14 DIAGNOSIS — F419 Anxiety disorder, unspecified: Secondary | ICD-10-CM | POA: Insufficient documentation

## 2022-06-14 DIAGNOSIS — Z7982 Long term (current) use of aspirin: Secondary | ICD-10-CM | POA: Diagnosis not present

## 2022-06-14 DIAGNOSIS — M1711 Unilateral primary osteoarthritis, right knee: Secondary | ICD-10-CM | POA: Insufficient documentation

## 2022-06-14 DIAGNOSIS — Z87891 Personal history of nicotine dependence: Secondary | ICD-10-CM | POA: Insufficient documentation

## 2022-06-14 DIAGNOSIS — Z79899 Other long term (current) drug therapy: Secondary | ICD-10-CM | POA: Insufficient documentation

## 2022-06-14 DIAGNOSIS — R9389 Abnormal findings on diagnostic imaging of other specified body structures: Secondary | ICD-10-CM | POA: Diagnosis not present

## 2022-06-14 DIAGNOSIS — I10 Essential (primary) hypertension: Secondary | ICD-10-CM | POA: Insufficient documentation

## 2022-06-14 DIAGNOSIS — G8929 Other chronic pain: Secondary | ICD-10-CM | POA: Insufficient documentation

## 2022-06-14 DIAGNOSIS — N950 Postmenopausal bleeding: Secondary | ICD-10-CM | POA: Diagnosis present

## 2022-06-14 DIAGNOSIS — E669 Obesity, unspecified: Secondary | ICD-10-CM | POA: Insufficient documentation

## 2022-06-14 DIAGNOSIS — E785 Hyperlipidemia, unspecified: Secondary | ICD-10-CM | POA: Insufficient documentation

## 2022-06-14 DIAGNOSIS — N841 Polyp of cervix uteri: Secondary | ICD-10-CM | POA: Diagnosis not present

## 2022-06-14 DIAGNOSIS — N840 Polyp of corpus uteri: Secondary | ICD-10-CM | POA: Clinically undetermined

## 2022-06-14 DIAGNOSIS — K219 Gastro-esophageal reflux disease without esophagitis: Secondary | ICD-10-CM | POA: Diagnosis not present

## 2022-06-14 HISTORY — PX: HYSTEROSCOPY WITH D & C: SHX1775

## 2022-06-14 SURGERY — DILATATION AND CURETTAGE /HYSTEROSCOPY
Anesthesia: General

## 2022-06-14 MED ORDER — IBUPROFEN 600 MG PO TABS: 600.0000 mg | ORAL_TABLET | Freq: Four times a day (QID) | ORAL | 0 refills | Status: AC | PRN

## 2022-06-14 MED ORDER — SILVER NITRATE-POT NITRATE 75-25 % EX MISC
CUTANEOUS | Status: DC | PRN
  Administered 2022-06-14: 2

## 2022-06-14 MED ORDER — PROPOFOL 10 MG/ML IV BOLUS
INTRAVENOUS | Status: AC
  Filled 2022-06-14: qty 20

## 2022-06-14 MED ORDER — ONDANSETRON HCL 4 MG/2ML IJ SOLN
4.0000 mg | Freq: Once | INTRAMUSCULAR | Status: AC | PRN
  Administered 2022-06-14: 4 mg via INTRAVENOUS

## 2022-06-14 MED ORDER — FENTANYL CITRATE (PF) 100 MCG/2ML IJ SOLN
INTRAMUSCULAR | Status: AC
  Filled 2022-06-14: qty 2

## 2022-06-14 MED ORDER — OXYCODONE HCL 5 MG PO TABS: 5.0000 mg | ORAL_TABLET | Freq: Once | ORAL | Status: DC | PRN

## 2022-06-14 MED ORDER — DEXAMETHASONE SODIUM PHOSPHATE 10 MG/ML IJ SOLN
INTRAMUSCULAR | Status: AC
  Filled 2022-06-14: qty 1

## 2022-06-14 MED ORDER — LACTATED RINGERS IV SOLN: INTRAVENOUS | Status: DC

## 2022-06-14 MED ORDER — ORAL CARE MOUTH RINSE: 15.0000 mL | Freq: Once | OROMUCOSAL | Status: AC

## 2022-06-14 MED ORDER — ACETAMINOPHEN 10 MG/ML IV SOLN
INTRAVENOUS | Status: AC
  Filled 2022-06-14: qty 100

## 2022-06-14 MED ORDER — MIDAZOLAM HCL 2 MG/2ML IJ SOLN
INTRAMUSCULAR | Status: AC
  Filled 2022-06-14: qty 2

## 2022-06-14 MED ORDER — FENTANYL CITRATE (PF) 100 MCG/2ML IJ SOLN: 25.0000 ug | INTRAMUSCULAR | Status: DC | PRN

## 2022-06-14 MED ORDER — FENTANYL CITRATE (PF) 100 MCG/2ML IJ SOLN
INTRAMUSCULAR | Status: DC | PRN
  Administered 2022-06-14 (×2): 50 ug via INTRAVENOUS

## 2022-06-14 MED ORDER — ONDANSETRON HCL 4 MG/2ML IJ SOLN
INTRAMUSCULAR | Status: AC
  Filled 2022-06-14: qty 2

## 2022-06-14 MED ORDER — CHLORHEXIDINE GLUCONATE 0.12 % MT SOLN
OROMUCOSAL | Status: AC
  Administered 2022-06-14: 15 mL via OROMUCOSAL
  Filled 2022-06-14: qty 15

## 2022-06-14 MED ORDER — SODIUM CHLORIDE 0.9 % IR SOLN
Status: DC | PRN
  Administered 2022-06-14: 3000 mL

## 2022-06-14 MED ORDER — DEXAMETHASONE SODIUM PHOSPHATE 10 MG/ML IJ SOLN
INTRAMUSCULAR | Status: DC | PRN
  Administered 2022-06-14: 8 mg via INTRAVENOUS

## 2022-06-14 MED ORDER — LIDOCAINE HCL (CARDIAC) PF 100 MG/5ML IV SOSY
PREFILLED_SYRINGE | INTRAVENOUS | Status: DC | PRN
  Administered 2022-06-14: 80 mg via INTRAVENOUS

## 2022-06-14 MED ORDER — ACETAMINOPHEN 10 MG/ML IV SOLN
1000.0000 mg | Freq: Once | INTRAVENOUS | Status: DC | PRN
  Administered 2022-06-14: 1000 mg via INTRAVENOUS

## 2022-06-14 MED ORDER — FAMOTIDINE 20 MG PO TABS
ORAL_TABLET | ORAL | Status: AC
  Administered 2022-06-14: 20 mg via ORAL
  Filled 2022-06-14: qty 1

## 2022-06-14 MED ORDER — EPHEDRINE SULFATE (PRESSORS) 50 MG/ML IJ SOLN
INTRAMUSCULAR | Status: DC | PRN
  Administered 2022-06-14: 10 mg via INTRAVENOUS
  Administered 2022-06-14: 5 mg via INTRAVENOUS
  Administered 2022-06-14: 10 mg via INTRAVENOUS

## 2022-06-14 MED ORDER — SUCCINYLCHOLINE CHLORIDE 200 MG/10ML IV SOSY
PREFILLED_SYRINGE | INTRAVENOUS | Status: DC | PRN
  Administered 2022-06-14: 100 mg via INTRAVENOUS

## 2022-06-14 MED ORDER — 0.9 % SODIUM CHLORIDE (POUR BTL) OPTIME
TOPICAL | Status: DC | PRN
  Administered 2022-06-14: 500 mL

## 2022-06-14 MED ORDER — CHLORHEXIDINE GLUCONATE 0.12 % MT SOLN: 15.0000 mL | Freq: Once | OROMUCOSAL | Status: AC

## 2022-06-14 MED ORDER — MIDAZOLAM HCL 2 MG/2ML IJ SOLN
INTRAMUSCULAR | Status: DC | PRN
  Administered 2022-06-14: 2 mg via INTRAVENOUS

## 2022-06-14 MED ORDER — SUCCINYLCHOLINE CHLORIDE 200 MG/10ML IV SOSY
PREFILLED_SYRINGE | INTRAVENOUS | Status: AC
  Filled 2022-06-14: qty 10

## 2022-06-14 MED ORDER — SILVER NITRATE-POT NITRATE 75-25 % EX MISC
CUTANEOUS | Status: AC
  Filled 2022-06-14: qty 10

## 2022-06-14 MED ORDER — ONDANSETRON HCL 4 MG/2ML IJ SOLN
INTRAMUSCULAR | Status: DC | PRN
  Administered 2022-06-14: 4 mg via INTRAVENOUS

## 2022-06-14 MED ORDER — LIDOCAINE HCL (PF) 2 % IJ SOLN
INTRAMUSCULAR | Status: AC
  Filled 2022-06-14: qty 5

## 2022-06-14 MED ORDER — OXYCODONE HCL 5 MG/5ML PO SOLN: 5.0000 mg | Freq: Once | ORAL | Status: DC | PRN

## 2022-06-14 MED ORDER — PROPOFOL 10 MG/ML IV BOLUS
INTRAVENOUS | Status: DC | PRN
  Administered 2022-06-14: 80 mg via INTRAVENOUS
  Administered 2022-06-14: 60 mg via INTRAVENOUS
  Administered 2022-06-14: 120 mg via INTRAVENOUS

## 2022-06-14 MED ORDER — FAMOTIDINE 20 MG PO TABS: 20.0000 mg | ORAL_TABLET | Freq: Once | ORAL | Status: AC

## 2022-06-14 SURGICAL SUPPLY — 31 items
BAG URINE DRAIN 2000ML AR STRL (UROLOGICAL SUPPLIES) IMPLANT
CATH FOLEY 2WAY  5CC 16FR (CATHETERS) ×1
CATH URTH 16FR FL 2W BLN LF (CATHETERS) IMPLANT
COUNTER NEEDLE 20/40 LG (NEEDLE) IMPLANT
DEVICE MYOSURE LITE (MISCELLANEOUS) IMPLANT
DEVICE MYOSURE REACH (MISCELLANEOUS) IMPLANT
DRSG TELFA 3X8 NADH STRL (GAUZE/BANDAGES/DRESSINGS) IMPLANT
ELECT REM PT RETURN 9FT ADLT (ELECTROSURGICAL) ×1
ELECTRODE REM PT RTRN 9FT ADLT (ELECTROSURGICAL) ×1 IMPLANT
GAUZE 4X4 16PLY ~~LOC~~+RFID DBL (SPONGE) IMPLANT
GLOVE BIO SURGEON STRL SZ7 (GLOVE) ×1 IMPLANT
GLOVE BIOGEL PI IND STRL 7.5 (GLOVE) ×1 IMPLANT
GOWN STRL REUS W/ TWL LRG LVL3 (GOWN DISPOSABLE) ×2 IMPLANT
GOWN STRL REUS W/TWL LRG LVL3 (GOWN DISPOSABLE) ×2
IV NS IRRIG 3000ML ARTHROMATIC (IV SOLUTION) ×1 IMPLANT
KIT PROCEDURE FLUENT (KITS) ×1 IMPLANT
KIT TURNOVER CYSTO (KITS) ×1 IMPLANT
MANIFOLD NEPTUNE II (INSTRUMENTS) ×1 IMPLANT
PACK DNC HYST (MISCELLANEOUS) ×1 IMPLANT
PAD OB MATERNITY 4.3X12.25 (PERSONAL CARE ITEMS) ×1 IMPLANT
PAD PREP 24X41 OB/GYN DISP (PERSONAL CARE ITEMS) ×1 IMPLANT
PENCIL SMOKE EVACUATOR (MISCELLANEOUS) IMPLANT
SCRUB CHG 4% DYNA-HEX 4OZ (MISCELLANEOUS) ×1 IMPLANT
SEAL ROD LENS SCOPE MYOSURE (ABLATOR) ×1 IMPLANT
SET CYSTO W/LG BORE CLAMP LF (SET/KITS/TRAYS/PACK) IMPLANT
SURGILUBE 2OZ TUBE FLIPTOP (MISCELLANEOUS) ×1 IMPLANT
SUT VIC AB 0 CT1 36 (SUTURE) IMPLANT
SUT VIC AB 2-0 CT2 27 (SUTURE) IMPLANT
TRAP FLUID SMOKE EVACUATOR (MISCELLANEOUS) ×1 IMPLANT
TUBING CONNECTING 10 (TUBING) ×1 IMPLANT
WATER STERILE IRR 500ML POUR (IV SOLUTION) ×1 IMPLANT

## 2022-06-14 NOTE — Anesthesia Postprocedure Evaluation (Signed)
Anesthesia Post Note  Patient: Gabriella Paul  Procedure(s) Performed: DILATATION AND CURETTAGE /HYSTEROSCOPY/ CERVICAL POLYPECTOMY  Patient location during evaluation: PACU Anesthesia Type: General Level of consciousness: awake Pain management: satisfactory to patient Vital Signs Assessment: post-procedure vital signs reviewed and stable Respiratory status: spontaneous breathing and respiratory function stable Cardiovascular status: stable Anesthetic complications: yes  Encounter Notable Events  Notable Event Outcome Phase Comment  Difficult to intubate - expected  Intraprocedure Filed from anesthesia note documentation.     Last Vitals:  Vitals:   06/14/22 0915 06/14/22 0930  BP: 106/62 103/63  Pulse: (!) 58 (!) 58  Resp: 12 16  Temp:    SpO2: 93% 95%    Last Pain:  Vitals:   06/14/22 0930  TempSrc:   PainSc: 2                  VAN STAVEREN,Antonisha Waskey

## 2022-06-14 NOTE — Interval H&P Note (Signed)
History and Physical Interval Note:  06/14/2022 7:17 AM  Gabriella Paul  has presented today for surgery, with the diagnosis of postmenopausal bleeding, thickened endometrium, cervical polyp.  The various methods of treatment have been discussed with the patient and family. After consideration of risks, benefits and other options for treatment, the patient has consented to  Procedure(s): DILATATION AND CURETTAGE /HYSTEROSCOPY/ CERVICAL POLYPECTOMY (N/A) as a surgical intervention.  The patient's history has been reviewed, patient examined, no change in status, stable for surgery.  I have reviewed the patient's chart and labs.  Questions were answered to the patient's satisfaction.  Consents reviewed. Patient wishes to proceed.  Prentice Docker, MD, [facility] 06/14/2022 7:18 AM

## 2022-06-14 NOTE — Discharge Instructions (Signed)
AMBULATORY SURGERY  ?DISCHARGE INSTRUCTIONS ? ? ?The drugs that you were given will stay in your system until tomorrow so for the next 24 hours you should not: ? ?Drive an automobile ?Make any legal decisions ?Drink any alcoholic beverage ? ? ?You may resume regular meals tomorrow.  Today it is better to start with liquids and gradually work up to solid foods. ? ?You may eat anything you prefer, but it is better to start with liquids, then soup and crackers, and gradually work up to solid foods. ? ? ?Please notify your doctor immediately if you have any unusual bleeding, trouble breathing, redness and pain at the surgery site, drainage, fever, or pain not relieved by medication. ? ? ? ?Additional Instructions: ? ? ? ?Please contact your physician with any problems or Same Day Surgery at [phone], Monday through Friday 6 am to 4 pm, or [place] at [place] Main number at [phone].  ?

## 2022-06-14 NOTE — Op Note (Signed)
Operative Note   Name: Gabriella Paul  Date of Service: 06/14/2022  DOB: 09-Mar-1951  MRN: [id number]   PRE-OP DIAGNOSIS:  1) Postmenopausal bleeding 2) Cervical polyp 3) Thickened endometrium    POST-OP DIAGNOSIS:  1) Postmenopausal bleeding 2) Cervical polyp 3) Thickened endometrium  4) Endometrial polyps   SURGEON: Surgeon(s) and Role:    Will Bonnet, MD - Primary  PROCEDURE: Procedure(s): 1) Cervical polypectomy 2) Dilation and curettage, endometrial polypectomy  ANESTHESIA: General LMA  ESTIMATED BLOOD LOSS: 20  DRAINS: none   TOTAL IV FLUIDS: 800 mL crystalloid  SPECIMENS:  1) Endometrial curettings and endometrial polyps 2) Cervical polyp  VTE PROPHYLAXIS: SCDs to the bilateral lower extremities  ANTIBIOTICS: none indicated and none given  FLUID DEFICIT: 564 mL  COMPLICATIONS: none  DISPOSITION: PACU - hemodynamically stable.  CONDITION: stable  INDICATION: 72 y.o. female who noted postmenopausal bleeding. On exam in clinic a large cervical polyp was noted. A pelvic ultrasound also showed a thickened endometrium.  She was taken to the OR to remove her cervical polyp given the size and she was taken for hysteroscopy with dilation and curettage given her thickened endometrial lining.   FINDINGS: Exam under anesthesia revealed small, mobile anteverted uterus with no masses and bilateral adnexa without masses or fullness. Hysteroscopy revealed a small right anterior polyp in the lower uterine segment and a polyp in the area near the left cornu.  Otherwise, she had a grossly normal appearing uterine cavity with bilateral tubal ostia and normal appearing endocervical canal.  PROCEDURE IN DETAIL:  After informed consent was obtained, the patient was taken to the operating room where anesthesia was obtained without difficulty. The patient was positioned in the dorsal lithotomy position in [place].  The patient's bladder was catheterized with an in and out  foley catheter.  The patient was examined under anesthesia, with the above noted findings.  The bi-valved speculum was placed inside the patient's vagina, the polyp was grasped with ring forceps and a tie on a pass was used to secure the base of the polyp at the cervix.  The polyp was then removed.  There was a remaining stump of polyp that was removed after removing the ring forceps.  There was some bleeding from this defect given the breadth of the pedicle of the polyp.  To decrease bleeding this was grasped with a ring forceps. The the anterior lip of the cervix was grasped with the tenaculum.  Then the cervix was progressively dilated to a 7 mm Hegar dilator.  The hysteroscope was introduced, with the above noted findings. Using the MyoSure Reach device the endometrial polyps were removed under visualization.  The hystersocope was removed and the uterine cavity was curetted until a gritty texture was noted, yielding small endometrial curettings.  The hysteroscope was reintroduced and the pressure was gradually reduced to a level that was well below the MAP.  Hemostasis was noted within the uterus.  The hysteroscope was removed.  Two figure-of-eight stitches of 0 Vicryl were used to close the defect from the polyp at the base on the cervix.  Hemostasis was noted at the site.  The tenaculum was removed and hemostasis was achieved with silver nitrate.  There continue to be no bleeding from the cervical os.  So, the procedure was terminated.  The vagina was verified to be free of instruments and sponges.  The speculum was removed.  The patient tolerated the procedure well.  Sponge, lap and needle counts were  correct x2.  The patient was taken to recovery room in excellent condition.  Will Bonnet, MD, [facility] 06/14/2022 8:49 AM

## 2022-06-14 NOTE — Anesthesia Preprocedure Evaluation (Signed)
Anesthesia Evaluation  Patient identified by MRN, date of birth, ID band Patient awake    Reviewed: Allergy & Precautions, NPO status , Patient's Chart, lab work & pertinent test results  History of Anesthesia Complications Negative for: history of anesthetic complications  Airway Mallampati: III  TM Distance: <3 FB Neck ROM: Full    Dental no notable dental hx. (+) Teeth Intact   Pulmonary neg pulmonary ROS, neg sleep apnea, neg COPD, Patient abstained from smoking.Not current smoker   Pulmonary exam normal breath sounds clear to auscultation       Cardiovascular Exercise Tolerance: Good METShypertension, Pt. on medications (-) CAD and (-) Past MI (-) dysrhythmias  Rhythm:Regular Rate:Normal - Systolic murmurs    Neuro/Psych  Headaches PSYCHIATRIC DISORDERS Anxiety        GI/Hepatic ,GERD  Poorly Controlled,,(+)     (-) substance abuse   Patient says she does not sleep flat on her back (side sleeper). Said last week when she arrived for surgery (which was cancelled due to her being late) she had intense chest burning that she thought was a heart attack; said EKG was fine and thinks it was heartburn. She is not on any PPI regularly. She also had a cup of black coffee at 0515 this morning. Denies any reflux symptoms today   Endo/Other  neg diabetes    Renal/GU negative Renal ROS     Musculoskeletal  (+) Arthritis ,    Abdominal  (+) + obese  Peds  Hematology   Anesthesia Other Findings Past Medical History: No date: Anxiety No date: Arthritis No date: Cancer ([place])     Comment:  skin No date: Chronic pain of right knee No date: Dysrhythmia     Comment:  racing heartbeat No date: Effusion of right knee No date: Headache No date: HLD (hyperlipidemia) No date: Hypertension No date: Osteoarthritis of right knee No date: PMB (postmenopausal bleeding) No date: Pneumonia     Comment:  1983 No date: Vitamin D  deficiency  Reproductive/Obstetrics                              Anesthesia Physical Anesthesia Plan  ASA: 2  Anesthesia Plan: General   Post-op Pain Management:    Induction: Intravenous and Rapid sequence  PONV Risk Score and Plan: 4 or greater and Ondansetron and Dexamethasone  Airway Management Planned: Oral ETT and Video Laryngoscope Planned  Additional Equipment: None  Intra-op Plan:   Post-operative Plan: Extubation in OR  Informed Consent: I have reviewed the patients History and Physical, chart, labs and discussed the procedure including the risks, benefits and alternatives for the proposed anesthesia with the patient or authorized representative who has indicated his/her understanding and acceptance.     Dental advisory given  Plan Discussed with: CRNA and Surgeon  Anesthesia Plan Comments: (Plan on GETA, modified RSI due to questionable history of unmedicated GERD. Discussed risks of anesthesia with patient, including PONV, sore throat, lip/dental/eye damage. Rare risks discussed as well, such as cardiorespiratory and neurological sequelae, and allergic reactions. Discussed the role of CRNA in patient's perioperative care. Patient understands.)         Anesthesia Quick Evaluation

## 2022-06-14 NOTE — Anesthesia Procedure Notes (Signed)
Procedure Name: Intubation Date/Time: 06/14/2022 7:40 AM  Performed by: Aline Brochure, CRNAPre-anesthesia Checklist: Patient identified, Patient being monitored, Timeout performed, Emergency Drugs available and Suction available Patient Re-evaluated:Patient Re-evaluated prior to induction Oxygen Delivery Method: Circle system utilized Preoxygenation: Pre-oxygenation with 100% oxygen Induction Type: IV induction and Rapid sequence Laryngoscope Size: 3 and McGraph Grade View: Grade I Tube type: Oral Tube size: 7.0 mm Number of attempts: 1 Airway Equipment and Method: Stylet and Video-laryngoscopy Placement Confirmation: ETT inserted through vocal cords under direct vision, positive ETCO2 and breath sounds checked- equal and bilateral Secured at: 22 cm Tube secured with: Tape Dental Injury: Teeth and Oropharynx as per pre-operative assessment  Difficulty Due To: Difficult Airway- due to anterior larynx and Difficulty was anticipated

## 2022-06-14 NOTE — Transfer of Care (Signed)
Immediate Anesthesia Transfer of Care Note  Patient: Gabriella Paul  Procedure(s) Performed: DILATATION AND CURETTAGE /HYSTEROSCOPY/ CERVICAL POLYPECTOMY  Patient Location: PACU  Anesthesia Type:General  Level of Consciousness: drowsy  Airway & Oxygen Therapy: Patient Spontanous Breathing and Patient connected to face mask oxygen  Post-op Assessment: Report given to RN and Post -op Vital signs reviewed and stable  Post vital signs: Reviewed and stable  Last Vitals:  Vitals Value Taken Time  BP 112/63   Temp    Pulse 64 06/14/22 0858  Resp    SpO2 99 % 06/14/22 0858  Vitals shown include unvalidated device data.  Last Pain:  Vitals:   06/14/22 0613  TempSrc: Oral  PainSc: 6          Complications:  Encounter Notable Events  Notable Event Outcome Phase Comment  Difficult to intubate - expected  Intraprocedure Filed from anesthesia note documentation.

## 2022-06-15 ENCOUNTER — Encounter: Payer: Self-pay | Admitting: Obstetrics and Gynecology

## 2022-06-15 LAB — SURGICAL PATHOLOGY

## 2023-06-02 ENCOUNTER — Other Ambulatory Visit: Payer: Self-pay | Admitting: Internal Medicine

## 2023-06-02 DIAGNOSIS — Z1231 Encounter for screening mammogram for malignant neoplasm of breast: Secondary | ICD-10-CM

## 2023-09-14 ENCOUNTER — Ambulatory Visit
Admission: RE | Admit: 2023-09-14 | Discharge: 2023-09-14 | Disposition: A | Discharge status: Home / Self Care | Payer: Medicare Other | Source: Ambulatory Visit | Attending: Internal Medicine | Admitting: Internal Medicine

## 2023-09-14 DIAGNOSIS — Z1231 Encounter for screening mammogram for malignant neoplasm of breast: Secondary | ICD-10-CM | POA: Diagnosis present

## 2023-10-27 ENCOUNTER — Other Ambulatory Visit: Payer: Self-pay | Admitting: Internal Medicine

## 2023-10-27 DIAGNOSIS — I10 Essential (primary) hypertension: Secondary | ICD-10-CM

## 2023-10-27 DIAGNOSIS — E7849 Other hyperlipidemia: Secondary | ICD-10-CM

## 2023-11-24 ENCOUNTER — Ambulatory Visit
Admission: RE | Admit: 2023-11-24 | Discharge: 2023-11-24 | Disposition: A | Discharge status: Home / Self Care | Payer: Self-pay | Source: Ambulatory Visit | Attending: Internal Medicine | Admitting: Internal Medicine

## 2023-11-24 DIAGNOSIS — I10 Essential (primary) hypertension: Secondary | ICD-10-CM | POA: Insufficient documentation

## 2023-11-24 DIAGNOSIS — E7849 Other hyperlipidemia: Secondary | ICD-10-CM | POA: Insufficient documentation
